# Patient Record
Sex: Female | Born: 1990 | Race: Black or African American | Hispanic: No | Marital: Single | Smoking: Never smoker
Health system: Southern US, Community
[De-identification: ages and names within clinical notes are randomized; demographics above are authoritative.]

## PROBLEM LIST (undated history)

## (undated) DIAGNOSIS — Z8619 Personal history of other infectious and parasitic diseases: Secondary | ICD-10-CM

## (undated) DIAGNOSIS — D649 Anemia, unspecified: Secondary | ICD-10-CM

## (undated) HISTORY — DX: Anemia, unspecified: D64.9

## (undated) HISTORY — DX: Personal history of other infectious and parasitic diseases: Z86.19

## (undated) HISTORY — PX: NO PAST SURGERIES: SHX2092

---

## 2014-11-14 LAB — OB RESULTS CONSOLE ABO/RH: RH Type: POSITIVE

## 2014-11-14 LAB — OB RESULTS CONSOLE RUBELLA ANTIBODY, IGM: RUBELLA: NON-IMMUNE/NOT IMMUNE

## 2014-11-14 LAB — OB RESULTS CONSOLE ANTIBODY SCREEN: ANTIBODY SCREEN: NEGATIVE

## 2014-11-14 LAB — OB RESULTS CONSOLE RPR: RPR: NONREACTIVE

## 2014-11-14 LAB — OB RESULTS CONSOLE GC/CHLAMYDIA
CHLAMYDIA, DNA PROBE: NEGATIVE
GC PROBE AMP, GENITAL: NEGATIVE

## 2014-11-14 LAB — OB RESULTS CONSOLE HIV ANTIBODY (ROUTINE TESTING): HIV: NONREACTIVE

## 2014-11-14 LAB — OB RESULTS CONSOLE HEPATITIS B SURFACE ANTIGEN: Hepatitis B Surface Ag: NEGATIVE

## 2015-06-23 ENCOUNTER — Encounter (HOSPITAL_COMMUNITY): Payer: Self-pay | Admitting: *Deleted

## 2015-06-23 ENCOUNTER — Telehealth (HOSPITAL_COMMUNITY): Payer: Self-pay | Admitting: *Deleted

## 2015-06-23 LAB — OB RESULTS CONSOLE GBS: STREP GROUP B AG: POSITIVE

## 2015-06-23 NOTE — Telephone Encounter (Signed)
Preadmission screen  

## 2015-06-24 ENCOUNTER — Inpatient Hospital Stay (HOSPITAL_COMMUNITY)
Admission: AD | Admit: 2015-06-24 | Discharge: 2015-06-24 | Disposition: A | Discharge status: Home / Self Care | Payer: BLUE CROSS/BLUE SHIELD | Source: Ambulatory Visit | Attending: Obstetrics and Gynecology | Admitting: Obstetrics and Gynecology

## 2015-06-24 ENCOUNTER — Encounter (HOSPITAL_COMMUNITY): Payer: Self-pay | Admitting: *Deleted

## 2015-06-24 DIAGNOSIS — Z3A00 Weeks of gestation of pregnancy not specified: Secondary | ICD-10-CM

## 2015-06-24 NOTE — Discharge Instructions (Signed)
Fetal Movement Counts °Patient Name: __________________________________________________ Patient Due Date: ____________________ °Performing a fetal movement count is highly recommended in high-risk pregnancies, but it is good for every pregnant woman to do. Your health care provider may ask you to start counting fetal movements at 28 weeks of the pregnancy. Fetal movements often increase: °· After eating a full meal. °· After physical activity. °· After eating or drinking something sweet or cold. °· At rest. °Pay attention to when you feel the baby is most active. This will help you notice a pattern of your baby's sleep and wake cycles and what factors contribute to an increase in fetal movement. It is important to perform a fetal movement count at the same time each day when your baby is normally most active.  °HOW TO COUNT FETAL MOVEMENTS °1. Find a quiet and comfortable area to sit or lie down on your left side. Lying on your left side provides the best blood and oxygen circulation to your baby. °2. Write down the day and time on a sheet of paper or in a journal. °3. Start counting kicks, flutters, swishes, rolls, or jabs in a 2-hour period. You should feel at least 10 movements within 2 hours. °4. If you do not feel 10 movements in 2 hours, wait 2-3 hours and count again. Look for a change in the pattern or not enough counts in 2 hours. °SEEK MEDICAL CARE IF: °· You feel less than 10 counts in 2 hours, tried twice. °· There is no movement in over an hour. °· The pattern is changing or taking longer each day to reach 10 counts in 2 hours. °· You feel the baby is not moving as he or she usually does. °Date: ____________ Movements: ____________ Start time: ____________ Finish time: ____________  °Date: ____________ Movements: ____________ Start time: ____________ Finish time: ____________ °Date: ____________ Movements: ____________ Start time: ____________ Finish time: ____________ °Date: ____________ Movements:  ____________ Start time: ____________ Finish time: ____________ °Date: ____________ Movements: ____________ Start time: ____________ Finish time: ____________ °Date: ____________ Movements: ____________ Start time: ____________ Finish time: ____________ °Date: ____________ Movements: ____________ Start time: ____________ Finish time: ____________ °Date: ____________ Movements: ____________ Start time: ____________ Finish time: ____________  °Date: ____________ Movements: ____________ Start time: ____________ Finish time: ____________ °Date: ____________ Movements: ____________ Start time: ____________ Finish time: ____________ °Date: ____________ Movements: ____________ Start time: ____________ Finish time: ____________ °Date: ____________ Movements: ____________ Start time: ____________ Finish time: ____________ °Date: ____________ Movements: ____________ Start time: ____________ Finish time: ____________ °Date: ____________ Movements: ____________ Start time: ____________ Finish time: ____________ °Date: ____________ Movements: ____________ Start time: ____________ Finish time: ____________  °Date: ____________ Movements: ____________ Start time: ____________ Finish time: ____________ °Date: ____________ Movements: ____________ Start time: ____________ Finish time: ____________ °Date: ____________ Movements: ____________ Start time: ____________ Finish time: ____________ °Date: ____________ Movements: ____________ Start time: ____________ Finish time: ____________ °Date: ____________ Movements: ____________ Start time: ____________ Finish time: ____________ °Date: ____________ Movements: ____________ Start time: ____________ Finish time: ____________ °Date: ____________ Movements: ____________ Start time: ____________ Finish time: ____________  °Date: ____________ Movements: ____________ Start time: ____________ Finish time: ____________ °Date: ____________ Movements: ____________ Start time: ____________ Finish  time: ____________ °Date: ____________ Movements: ____________ Start time: ____________ Finish time: ____________ °Date: ____________ Movements: ____________ Start time: ____________ Finish time: ____________ °Date: ____________ Movements: ____________ Start time: ____________ Finish time: ____________ °Date: ____________ Movements: ____________ Start time: ____________ Finish time: ____________ °Date: ____________ Movements: ____________ Start time: ____________ Finish time: ____________  °Date: ____________ Movements: ____________ Start time: ____________ Finish   time: ____________ °Date: ____________ Movements: ____________ Start time: ____________ Finish time: ____________ °Date: ____________ Movements: ____________ Start time: ____________ Finish time: ____________ °Date: ____________ Movements: ____________ Start time: ____________ Finish time: ____________ °Date: ____________ Movements: ____________ Start time: ____________ Finish time: ____________ °Date: ____________ Movements: ____________ Start time: ____________ Finish time: ____________ °Date: ____________ Movements: ____________ Start time: ____________ Finish time: ____________  °Date: ____________ Movements: ____________ Start time: ____________ Finish time: ____________ °Date: ____________ Movements: ____________ Start time: ____________ Finish time: ____________ °Date: ____________ Movements: ____________ Start time: ____________ Finish time: ____________ °Date: ____________ Movements: ____________ Start time: ____________ Finish time: ____________ °Date: ____________ Movements: ____________ Start time: ____________ Finish time: ____________ °Date: ____________ Movements: ____________ Start time: ____________ Finish time: ____________ °Date: ____________ Movements: ____________ Start time: ____________ Finish time: ____________  °Date: ____________ Movements: ____________ Start time: ____________ Finish time: ____________ °Date: ____________  Movements: ____________ Start time: ____________ Finish time: ____________ °Date: ____________ Movements: ____________ Start time: ____________ Finish time: ____________ °Date: ____________ Movements: ____________ Start time: ____________ Finish time: ____________ °Date: ____________ Movements: ____________ Start time: ____________ Finish time: ____________ °Date: ____________ Movements: ____________ Start time: ____________ Finish time: ____________ °Date: ____________ Movements: ____________ Start time: ____________ Finish time: ____________  °Date: ____________ Movements: ____________ Start time: ____________ Finish time: ____________ °Date: ____________ Movements: ____________ Start time: ____________ Finish time: ____________ °Date: ____________ Movements: ____________ Start time: ____________ Finish time: ____________ °Date: ____________ Movements: ____________ Start time: ____________ Finish time: ____________ °Date: ____________ Movements: ____________ Start time: ____________ Finish time: ____________ °Date: ____________ Movements: ____________ Start time: ____________ Finish time: ____________ °  °This information is not intended to replace advice given to you by your health care provider. Make sure you discuss any questions you have with your health care provider. °  °Document Released: 03/31/2006 Document Revised: 03/22/2014 Document Reviewed: 12/27/2011 °Elsevier Interactive Patient Education ©2016 [employer] °Vaginal Delivery °During delivery, your health care provider will help you give birth to your baby. During a vaginal delivery, you will work to push the baby out of your vagina. However, before you can push your baby out, a few things need to happen. The opening of your uterus (cervix) has to soften, thin out, and open up (dilate) all the way to 10 cm. Also, your baby has to move down from the uterus into your vagina.  °SIGNS OF LABOR  °Your health care provider will first need to make sure you  are in labor. Signs of labor include:  °· Passing what is called the mucous plug before labor begins. This is a small amount of blood-stained mucus. °· Having regular, painful uterine contractions.   °· The time between contractions gets shorter.   °· The discomfort and pain gradually get more intense. °· Contraction pains get worse when walking and do not go away when resting.   °· Your cervix becomes thinner (effacement) and dilates. °BEFORE THE DELIVERY °Once you are in labor and admitted into the hospital or care center, your health care provider may do the following:  °5. Perform a complete physical exam. °6. Review any complications related to pregnancy or labor.  °7. Check your blood pressure, pulse, temperature, and heart rate (vital signs).   °8. Determine if, and when, the rupture of amniotic membranes occurred. °9. Do a vaginal exam (using a sterile glove and lubricant) to determine:   °1. The position (presentation) of the baby. Is the baby's head presenting first (vertex) in the birth canal (vagina), or are the feet or buttocks first (breech)?   °2. The level (station) of the baby's head within   the birth canal.   °3. The effacement and dilatation of the cervix.   °10. An electronic fetal monitor is usually placed on your abdomen when you first arrive. This is used to monitor your contractions and the baby's heart rate. °1. When the monitor is on your abdomen (external fetal monitor), it can only pick up the frequency and length of your contractions. It cannot tell the strength of your contractions. °2. If it becomes necessary for your health care provider to know exactly how strong your contractions are or to see exactly what the baby's heart rate is doing, an internal monitor may be inserted into your vagina and uterus. Your health care provider will discuss the benefits and risks of using an internal monitor and obtain your permission before inserting the device. °3. Continuous fetal monitoring may be  needed if you have an epidural, are receiving certain medicines (such as oxytocin), or have pregnancy or labor complications. °11. An IV access tube may be placed into a vein in your arm to deliver fluids and medicines if necessary. °THREE STAGES OF LABOR AND DELIVERY °Normal labor and delivery is divided into three stages. °First Stage °This stage starts when you begin to contract regularly and your cervix begins to efface and dilate. It ends when your cervix is completely open (fully dilated). The first stage is the longest stage of labor and can last from 3 hours to 15 hours.  °Several methods are available to help with labor pain. You and your health care provider will decide which option is best for you. Options include:  °· Opioid medicines. These are strong pain medicines that you can get through your IV tube or as a shot into your muscle. These medicines lessen pain but do not make it go away completely.  °· Epidural. A medicine is given through a thin tube that is inserted in your back. The medicine numbs the lower part of your body and prevents any pain in that area. °· Paracervical pain medicine. This is an injection of an anesthetic on each side of your cervix.   °· You may request natural childbirth, which does not involve the use of pain medicines or an epidural during labor and delivery. Instead, you will use other things, such as breathing exercises, to help cope with the pain. °Second Stage °The second stage of labor begins when your cervix is fully dilated at 10 cm. It continues until you push your baby down through the birth canal and the baby is born. This stage can take only minutes or several hours. °· The location of your baby's head as it moves through the birth canal is reported as a number called a station. If the baby's head has not started its descent, the station is described as being at minus 3 (-3). When your baby's head is at the zero station, it is at the middle of the birth canal  and is engaged in the pelvis. The station of your baby helps indicate the progress of the second stage of labor. °· When your baby is born, your health care provider may hold the baby with his or her head lowered to prevent amniotic fluid, mucus, and blood from getting into the baby's lungs. The baby's mouth and nose may be suctioned with a small bulb syringe to remove any additional fluid. °· Your health care provider may then place the baby on your stomach. It is important to keep the baby from getting cold. To do this, the health care provider will dry   the baby off, place the baby directly on your skin (with no blankets between you and the baby), and cover the baby with warm, dry blankets.   °· The umbilical cord is cut. °Third Stage °During the third stage of labor, your health care provider will deliver the placenta (afterbirth) and make sure your bleeding is under control. The delivery of the placenta usually takes about 5 minutes but can take up to 30 minutes. After the placenta is delivered, a medicine may be given either by IV or injection to help contract the uterus and control bleeding. If you are planning to breastfeed, you can try to do so now. °After you deliver the placenta, your uterus should contract and get very firm. If your uterus does not remain firm, your health care provider will massage it. This is important because the contraction of the uterus helps cut off bleeding at the site where the placenta was attached to your uterus. If your uterus does not contract properly and stay firm, you may continue to bleed heavily. If there is a lot of bleeding, medicines may be given to contract the uterus and stop the bleeding.  °  °This information is not intended to replace advice given to you by your health care provider. Make sure you discuss any questions you have with your health care provider. °  °Document Released: 12/09/2007 Document Revised: 03/22/2014 Document Reviewed: 10/27/2011 °Elsevier  Interactive Patient Education ©2016 [employer] Inc. ° °

## 2015-06-24 NOTE — MAU Note (Signed)
Pt reports contractions and pressure x 4 hours, some brown discharge.

## 2015-06-24 NOTE — MAU Note (Signed)
Dr. Rana SnareLowe notified of pt status, EFM, ctxs, SVE. Orders received to discharge pt home with labor instructions.

## 2015-06-25 ENCOUNTER — Encounter (HOSPITAL_COMMUNITY): Payer: Self-pay

## 2015-06-25 ENCOUNTER — Inpatient Hospital Stay (HOSPITAL_COMMUNITY)
Admission: AD | Admit: 2015-06-25 | Discharge: 2015-06-25 | Disposition: A | Discharge status: Home / Self Care | Payer: BLUE CROSS/BLUE SHIELD | Source: Ambulatory Visit | Attending: Obstetrics and Gynecology | Admitting: Obstetrics and Gynecology

## 2015-06-25 NOTE — Discharge Instructions (Signed)

## 2015-06-25 NOTE — Progress Notes (Signed)
Dr Langston MaskerMorris notified of no change in VE, contraction, FHR tracing orders received to discharge home

## 2015-06-25 NOTE — Progress Notes (Signed)
Notified of pt arrival in MAU and exam with FHR variables. Will monitor FHR until reactive and let patient walk

## 2015-06-26 ENCOUNTER — Encounter (HOSPITAL_COMMUNITY)
Admission: AD | Disposition: A | Discharge status: Home / Self Care | Payer: Self-pay | Source: Ambulatory Visit | Attending: Obstetrics & Gynecology

## 2015-06-26 ENCOUNTER — Inpatient Hospital Stay (HOSPITAL_COMMUNITY): Payer: BLUE CROSS/BLUE SHIELD

## 2015-06-26 ENCOUNTER — Inpatient Hospital Stay (HOSPITAL_COMMUNITY): Payer: BLUE CROSS/BLUE SHIELD | Admitting: Anesthesiology

## 2015-06-26 ENCOUNTER — Inpatient Hospital Stay (HOSPITAL_COMMUNITY)
Admission: AD | Admit: 2015-06-26 | Discharge: 2015-06-28 | DRG: 766 | Disposition: A | Discharge status: Home / Self Care | Payer: BLUE CROSS/BLUE SHIELD | Source: Ambulatory Visit | Attending: Obstetrics & Gynecology | Admitting: Obstetrics & Gynecology

## 2015-06-26 ENCOUNTER — Encounter (HOSPITAL_COMMUNITY): Payer: Self-pay

## 2015-06-26 DIAGNOSIS — Z880 Allergy status to penicillin: Secondary | ICD-10-CM | POA: Diagnosis not present

## 2015-06-26 DIAGNOSIS — Z3A40 40 weeks gestation of pregnancy: Secondary | ICD-10-CM

## 2015-06-26 DIAGNOSIS — O99824 Streptococcus B carrier state complicating childbirth: Secondary | ICD-10-CM | POA: Diagnosis present

## 2015-06-26 DIAGNOSIS — Z833 Family history of diabetes mellitus: Secondary | ICD-10-CM | POA: Diagnosis not present

## 2015-06-26 DIAGNOSIS — O4290 Premature rupture of membranes, unspecified as to length of time between rupture and onset of labor, unspecified weeks of gestation: Secondary | ICD-10-CM

## 2015-06-26 DIAGNOSIS — Z8249 Family history of ischemic heart disease and other diseases of the circulatory system: Secondary | ICD-10-CM | POA: Diagnosis not present

## 2015-06-26 DIAGNOSIS — O4202 Full-term premature rupture of membranes, onset of labor within 24 hours of rupture: Secondary | ICD-10-CM | POA: Diagnosis present

## 2015-06-26 DIAGNOSIS — Z3689 Encounter for other specified antenatal screening: Secondary | ICD-10-CM

## 2015-06-26 DIAGNOSIS — O4292 Full-term premature rupture of membranes, unspecified as to length of time between rupture and onset of labor: Secondary | ICD-10-CM

## 2015-06-26 DIAGNOSIS — O339 Maternal care for disproportion, unspecified: Principal | ICD-10-CM | POA: Diagnosis present

## 2015-06-26 DIAGNOSIS — Z3490 Encounter for supervision of normal pregnancy, unspecified, unspecified trimester: Secondary | ICD-10-CM

## 2015-06-26 LAB — CBC
HEMATOCRIT: 39.7 % (ref 36.0–46.0)
HEMOGLOBIN: 13.5 g/dL (ref 12.0–15.0)
MCH: 29.3 pg (ref 26.0–34.0)
MCHC: 34 g/dL (ref 30.0–36.0)
MCV: 86.3 fL (ref 78.0–100.0)
Platelets: 220 10*3/uL (ref 150–400)
RBC: 4.6 MIL/uL (ref 3.87–5.11)
RDW: 13.2 % (ref 11.5–15.5)
WBC: 7.2 10*3/uL (ref 4.0–10.5)

## 2015-06-26 LAB — TYPE AND SCREEN
ABO/RH(D): O POS
Antibody Screen: NEGATIVE

## 2015-06-26 LAB — RPR: RPR Ser Ql: NONREACTIVE

## 2015-06-26 LAB — POCT FERN TEST: POCT FERN TEST: POSITIVE

## 2015-06-26 LAB — ABO/RH: ABO/RH(D): O POS

## 2015-06-26 SURGERY — Surgical Case
Anesthesia: Epidural

## 2015-06-26 MED ORDER — OXYTOCIN 10 UNIT/ML IJ SOLN
1.0000 m[IU]/min | INTRAVENOUS | Status: DC
  Administered 2015-06-26: 2 m[IU]/min via INTRAVENOUS

## 2015-06-26 MED ORDER — MENTHOL 3 MG MT LOZG: 1.0000 | LOZENGE | OROMUCOSAL | Status: DC | PRN

## 2015-06-26 MED ORDER — PHENYLEPHRINE 8 MG IN D5W 100 ML (0.08MG/ML) PREMIX OPTIME: INJECTION | INTRAVENOUS | Status: DC | PRN

## 2015-06-26 MED ORDER — NALBUPHINE HCL 10 MG/ML IJ SOLN: 5.0000 mg | Freq: Once | INTRAMUSCULAR | Status: DC | PRN

## 2015-06-26 MED ORDER — OXYCODONE-ACETAMINOPHEN 5-325 MG PO TABS: 2.0000 | ORAL_TABLET | ORAL | Status: DC | PRN

## 2015-06-26 MED ORDER — MEPERIDINE HCL 25 MG/ML IJ SOLN
INTRAMUSCULAR | Status: DC | PRN
  Administered 2015-06-26 (×2): 12.5 mg via INTRAVENOUS

## 2015-06-26 MED ORDER — ACETAMINOPHEN 325 MG PO TABS
650.0000 mg | ORAL_TABLET | ORAL | Status: DC | PRN
  Administered 2015-06-27 – 2015-06-28 (×4): 650 mg via ORAL
  Filled 2015-06-26 (×4): qty 2

## 2015-06-26 MED ORDER — OXYTOCIN 10 UNIT/ML IJ SOLN
2.5000 [IU]/h | INTRAVENOUS | Status: DC
  Filled 2015-06-26: qty 4

## 2015-06-26 MED ORDER — MORPHINE SULFATE (PF) 0.5 MG/ML IJ SOLN
INTRAMUSCULAR | Status: DC | PRN
  Administered 2015-06-26: 3 mg via EPIDURAL

## 2015-06-26 MED ORDER — ONDANSETRON HCL 4 MG/2ML IJ SOLN
INTRAMUSCULAR | Status: DC | PRN
  Administered 2015-06-26: 4 mg via INTRAVENOUS

## 2015-06-26 MED ORDER — OXYTOCIN BOLUS FROM INFUSION: 500.0000 mL | INTRAVENOUS | Status: DC

## 2015-06-26 MED ORDER — SODIUM BICARBONATE 8.4 % IV SOLN
INTRAVENOUS | Status: DC | PRN
  Administered 2015-06-26 (×2): 5 mL via EPIDURAL

## 2015-06-26 MED ORDER — IBUPROFEN 800 MG PO TABS
800.0000 mg | ORAL_TABLET | Freq: Three times a day (TID) | ORAL | Status: DC | PRN
  Administered 2015-06-27 – 2015-06-28 (×4): 800 mg via ORAL
  Filled 2015-06-26 (×4): qty 1

## 2015-06-26 MED ORDER — OXYTOCIN 10 UNIT/ML IJ SOLN
INTRAMUSCULAR | Status: AC
  Filled 2015-06-26: qty 4

## 2015-06-26 MED ORDER — GENTAMICIN SULFATE 40 MG/ML IJ SOLN: INTRAMUSCULAR | Status: DC

## 2015-06-26 MED ORDER — ONDANSETRON HCL 4 MG/2ML IJ SOLN
INTRAMUSCULAR | Status: AC
  Filled 2015-06-26: qty 2

## 2015-06-26 MED ORDER — ONDANSETRON HCL 4 MG/2ML IJ SOLN: 4.0000 mg | Freq: Four times a day (QID) | INTRAMUSCULAR | Status: DC | PRN

## 2015-06-26 MED ORDER — DIPHENHYDRAMINE HCL 25 MG PO CAPS: 25.0000 mg | ORAL_CAPSULE | Freq: Four times a day (QID) | ORAL | Status: DC | PRN

## 2015-06-26 MED ORDER — LIDOCAINE HCL (PF) 1 % IJ SOLN: 30.0000 mL | INTRAMUSCULAR | Status: DC | PRN

## 2015-06-26 MED ORDER — SODIUM CHLORIDE 0.9% FLUSH: 3.0000 mL | INTRAVENOUS | Status: DC | PRN

## 2015-06-26 MED ORDER — SODIUM CHLORIDE 0.9% FLUSH: 3.0000 mL | Freq: Two times a day (BID) | INTRAVENOUS | Status: DC

## 2015-06-26 MED ORDER — ACETAMINOPHEN 500 MG PO TABS
1000.0000 mg | ORAL_TABLET | Freq: Four times a day (QID) | ORAL | Status: AC
  Administered 2015-06-26 – 2015-06-27 (×3): 1000 mg via ORAL
  Filled 2015-06-26 (×3): qty 2

## 2015-06-26 MED ORDER — MEPERIDINE HCL 25 MG/ML IJ SOLN
INTRAMUSCULAR | Status: AC
  Filled 2015-06-26: qty 1

## 2015-06-26 MED ORDER — DIBUCAINE 1 % RE OINT: 1.0000 "application " | TOPICAL_OINTMENT | RECTAL | Status: DC | PRN

## 2015-06-26 MED ORDER — NALBUPHINE HCL 10 MG/ML IJ SOLN: 5.0000 mg | INTRAMUSCULAR | Status: DC | PRN

## 2015-06-26 MED ORDER — TERBUTALINE SULFATE 1 MG/ML IJ SOLN: 0.2500 mg | Freq: Once | INTRAMUSCULAR | Status: DC | PRN

## 2015-06-26 MED ORDER — LIDOCAINE-EPINEPHRINE (PF) 2 %-1:200000 IJ SOLN
INTRAMUSCULAR | Status: AC
Start: 2015-06-26 — End: 2015-06-26
  Filled 2015-06-26: qty 20

## 2015-06-26 MED ORDER — DIPHENHYDRAMINE HCL 25 MG PO CAPS
25.0000 mg | ORAL_CAPSULE | ORAL | Status: DC | PRN
  Filled 2015-06-26: qty 1

## 2015-06-26 MED ORDER — OXYCODONE HCL 5 MG PO TABS
5.0000 mg | ORAL_TABLET | ORAL | Status: DC | PRN
  Administered 2015-06-27 – 2015-06-28 (×4): 5 mg via ORAL
  Filled 2015-06-26 (×4): qty 1

## 2015-06-26 MED ORDER — HYDROMORPHONE HCL 1 MG/ML IJ SOLN
0.2500 mg | INTRAMUSCULAR | Status: DC | PRN
Start: 2015-06-26 — End: 2015-06-26

## 2015-06-26 MED ORDER — KETOROLAC TROMETHAMINE 30 MG/ML IJ SOLN
INTRAMUSCULAR | Status: AC
  Administered 2015-06-26: 30 mg
  Filled 2015-06-26: qty 1

## 2015-06-26 MED ORDER — SCOPOLAMINE 1 MG/3DAYS TD PT72
MEDICATED_PATCH | TRANSDERMAL | Status: AC
Start: 2015-06-26 — End: 2015-06-26
  Filled 2015-06-26: qty 1

## 2015-06-26 MED ORDER — LACTATED RINGERS IV SOLN
500.0000 mL | INTRAVENOUS | Status: DC | PRN
  Administered 2015-06-26: 500 mL via INTRAVENOUS

## 2015-06-26 MED ORDER — BISACODYL 10 MG RE SUPP: 10.0000 mg | Freq: Every day | RECTAL | Status: DC | PRN

## 2015-06-26 MED ORDER — SCOPOLAMINE 1 MG/3DAYS TD PT72
MEDICATED_PATCH | TRANSDERMAL | Status: DC | PRN
  Administered 2015-06-26: 1 via TRANSDERMAL

## 2015-06-26 MED ORDER — MEPERIDINE HCL 25 MG/ML IJ SOLN: 6.2500 mg | INTRAMUSCULAR | Status: DC | PRN

## 2015-06-26 MED ORDER — SODIUM CHLORIDE 0.9 % IR SOLN
Status: DC | PRN
  Administered 2015-06-26: 1000 mL

## 2015-06-26 MED ORDER — EPHEDRINE 5 MG/ML INJ: 10.0000 mg | INTRAVENOUS | Status: DC | PRN

## 2015-06-26 MED ORDER — OXYCODONE-ACETAMINOPHEN 5-325 MG PO TABS
1.0000 | ORAL_TABLET | ORAL | Status: DC | PRN
Start: 2015-06-26 — End: 2015-06-26

## 2015-06-26 MED ORDER — FLEET ENEMA 7-19 GM/118ML RE ENEM: 1.0000 | ENEMA | RECTAL | Status: DC | PRN

## 2015-06-26 MED ORDER — LACTATED RINGERS IV SOLN
40.0000 [IU] | INTRAVENOUS | Status: DC | PRN
  Administered 2015-06-26: 40 [IU] via INTRAVENOUS

## 2015-06-26 MED ORDER — PHENYLEPHRINE 40 MCG/ML (10ML) SYRINGE FOR IV PUSH (FOR BLOOD PRESSURE SUPPORT)
PREFILLED_SYRINGE | INTRAVENOUS | Status: AC
  Filled 2015-06-26: qty 10

## 2015-06-26 MED ORDER — COCONUT OIL OIL: 1.0000 "application " | TOPICAL_OIL | Status: DC | PRN

## 2015-06-26 MED ORDER — WITCH HAZEL-GLYCERIN EX PADS: 1.0000 "application " | MEDICATED_PAD | CUTANEOUS | Status: DC | PRN

## 2015-06-26 MED ORDER — ZOLPIDEM TARTRATE 5 MG PO TABS: 5.0000 mg | ORAL_TABLET | Freq: Every evening | ORAL | Status: DC | PRN

## 2015-06-26 MED ORDER — TETANUS-DIPHTH-ACELL PERTUSSIS 5-2.5-18.5 LF-MCG/0.5 IM SUSP: 0.5000 mL | Freq: Once | INTRAMUSCULAR | Status: DC

## 2015-06-26 MED ORDER — SENNOSIDES-DOCUSATE SODIUM 8.6-50 MG PO TABS
2.0000 | ORAL_TABLET | ORAL | Status: DC
  Administered 2015-06-27 (×2): 2 via ORAL
  Filled 2015-06-26 (×2): qty 2

## 2015-06-26 MED ORDER — SCOPOLAMINE 1 MG/3DAYS TD PT72: 1.0000 | MEDICATED_PATCH | Freq: Once | TRANSDERMAL | Status: DC

## 2015-06-26 MED ORDER — SIMETHICONE 80 MG PO CHEW: 80.0000 mg | CHEWABLE_TABLET | ORAL | Status: DC | PRN

## 2015-06-26 MED ORDER — SIMETHICONE 80 MG PO CHEW
80.0000 mg | CHEWABLE_TABLET | ORAL | Status: DC
  Filled 2015-06-26 (×2): qty 1

## 2015-06-26 MED ORDER — OXYTOCIN 10 UNIT/ML IJ SOLN
2.5000 [IU]/h | INTRAVENOUS | Status: AC
  Administered 2015-06-27: 2.5 [IU]/h via INTRAVENOUS

## 2015-06-26 MED ORDER — MEASLES, MUMPS & RUBELLA VAC ~~LOC~~ INJ
0.5000 mL | INJECTION | Freq: Once | SUBCUTANEOUS | Status: DC
  Filled 2015-06-26: qty 0.5

## 2015-06-26 MED ORDER — NALOXONE HCL 2 MG/2ML IJ SOSY
1.0000 ug/kg/h | PREFILLED_SYRINGE | INTRAVENOUS | Status: DC | PRN
  Filled 2015-06-26: qty 2

## 2015-06-26 MED ORDER — ONDANSETRON HCL 4 MG/2ML IJ SOLN: 4.0000 mg | Freq: Three times a day (TID) | INTRAMUSCULAR | Status: DC | PRN

## 2015-06-26 MED ORDER — DIPHENHYDRAMINE HCL 50 MG/ML IJ SOLN: 12.5000 mg | INTRAMUSCULAR | Status: DC | PRN

## 2015-06-26 MED ORDER — ACETAMINOPHEN 325 MG PO TABS
650.0000 mg | ORAL_TABLET | ORAL | Status: DC | PRN
  Administered 2015-06-26: 650 mg via ORAL
  Filled 2015-06-26: qty 2

## 2015-06-26 MED ORDER — DEXAMETHASONE SODIUM PHOSPHATE 4 MG/ML IJ SOLN
INTRAMUSCULAR | Status: AC
  Filled 2015-06-26: qty 1

## 2015-06-26 MED ORDER — PHENYLEPHRINE 40 MCG/ML (10ML) SYRINGE FOR IV PUSH (FOR BLOOD PRESSURE SUPPORT)
80.0000 ug | PREFILLED_SYRINGE | INTRAVENOUS | Status: DC | PRN
  Administered 2015-06-26: 80 ug via INTRAVENOUS

## 2015-06-26 MED ORDER — CITRIC ACID-SODIUM CITRATE 334-500 MG/5ML PO SOLN
30.0000 mL | ORAL | Status: DC | PRN
  Administered 2015-06-26: 30 mL via ORAL
  Filled 2015-06-26: qty 15

## 2015-06-26 MED ORDER — SIMETHICONE 80 MG PO CHEW
80.0000 mg | CHEWABLE_TABLET | Freq: Three times a day (TID) | ORAL | Status: DC
  Administered 2015-06-27 – 2015-06-28 (×7): 80 mg via ORAL
  Filled 2015-06-26 (×5): qty 1

## 2015-06-26 MED ORDER — BUTORPHANOL TARTRATE 1 MG/ML IJ SOLN: 1.0000 mg | INTRAMUSCULAR | Status: DC | PRN

## 2015-06-26 MED ORDER — LACTATED RINGERS IV SOLN
INTRAVENOUS | Status: DC
  Administered 2015-06-26 (×3): via INTRAVENOUS

## 2015-06-26 MED ORDER — FENTANYL 2.5 MCG/ML BUPIVACAINE 1/10 % EPIDURAL INFUSION (WH - ANES)
14.0000 mL/h | INTRAMUSCULAR | Status: DC | PRN
  Administered 2015-06-26: 14 mL/h via EPIDURAL
  Filled 2015-06-26: qty 125

## 2015-06-26 MED ORDER — FLEET ENEMA 7-19 GM/118ML RE ENEM: 1.0000 | ENEMA | Freq: Every day | RECTAL | Status: DC | PRN

## 2015-06-26 MED ORDER — KETOROLAC TROMETHAMINE 30 MG/ML IJ SOLN: 30.0000 mg | Freq: Once | INTRAMUSCULAR | Status: DC

## 2015-06-26 MED ORDER — LACTATED RINGERS IV SOLN: 500.0000 mL | Freq: Once | INTRAVENOUS | Status: DC

## 2015-06-26 MED ORDER — PHENYLEPHRINE 40 MCG/ML (10ML) SYRINGE FOR IV PUSH (FOR BLOOD PRESSURE SUPPORT)
80.0000 ug | PREFILLED_SYRINGE | INTRAVENOUS | Status: DC | PRN
  Filled 2015-06-26: qty 20

## 2015-06-26 MED ORDER — DEXAMETHASONE SODIUM PHOSPHATE 4 MG/ML IJ SOLN
INTRAMUSCULAR | Status: DC | PRN
  Administered 2015-06-26: 4 mg via INTRAVENOUS

## 2015-06-26 MED ORDER — NALOXONE HCL 0.4 MG/ML IJ SOLN: 0.4000 mg | INTRAMUSCULAR | Status: DC | PRN

## 2015-06-26 MED ORDER — CLINDAMYCIN PHOSPHATE 900 MG/50ML IV SOLN
900.0000 mg | Freq: Three times a day (TID) | INTRAVENOUS | Status: DC
  Administered 2015-06-26: 900 mg via INTRAVENOUS
  Filled 2015-06-26 (×2): qty 50

## 2015-06-26 MED ORDER — MORPHINE SULFATE (PF) 0.5 MG/ML IJ SOLN
INTRAMUSCULAR | Status: AC
Start: 2015-06-26 — End: 2015-06-26
  Filled 2015-06-26: qty 10

## 2015-06-26 MED ORDER — PRENATAL MULTIVITAMIN CH
1.0000 | ORAL_TABLET | Freq: Every day | ORAL | Status: DC
  Administered 2015-06-27 – 2015-06-28 (×2): 1 via ORAL
  Filled 2015-06-26 (×2): qty 1

## 2015-06-26 MED ORDER — LACTATED RINGERS IV SOLN
INTRAVENOUS | Status: DC | PRN
  Administered 2015-06-26 (×2): via INTRAVENOUS

## 2015-06-26 MED ORDER — SODIUM BICARBONATE 8.4 % IV SOLN
INTRAVENOUS | Status: AC
  Filled 2015-06-26: qty 50

## 2015-06-26 MED ORDER — SODIUM CHLORIDE 0.9 % IV SOLN: 250.0000 mL | INTRAVENOUS | Status: DC

## 2015-06-26 MED ORDER — LIDOCAINE HCL (PF) 1 % IJ SOLN
INTRAMUSCULAR | Status: DC | PRN
  Administered 2015-06-26: 5 mL via EPIDURAL
  Administered 2015-06-26: 8 mL via EPIDURAL

## 2015-06-26 SURGICAL SUPPLY — 29 items
CHLORAPREP W/TINT 26ML (MISCELLANEOUS) ×3 IMPLANT
CLAMP CORD UMBIL (MISCELLANEOUS) IMPLANT
CLOSURE STERI STRIP 1/2 X4 (GAUZE/BANDAGES/DRESSINGS) ×2 IMPLANT
CLOSURE WOUND 1/2 X4 (GAUZE/BANDAGES/DRESSINGS) ×1
CLOTH BEACON ORANGE TIMEOUT ST (SAFETY) ×3 IMPLANT
DRSG OPSITE POSTOP 4X10 (GAUZE/BANDAGES/DRESSINGS) ×3 IMPLANT
ELECT REM PT RETURN 9FT ADLT (ELECTROSURGICAL) ×3
ELECTRODE REM PT RTRN 9FT ADLT (ELECTROSURGICAL) ×1 IMPLANT
EXTRACTOR VACUUM M CUP 4 TUBE (SUCTIONS) IMPLANT
EXTRACTOR VACUUM M CUP 4' TUBE (SUCTIONS)
GLOVE BIO SURGEON STRL SZ7 (GLOVE) ×3 IMPLANT
GLOVE BIOGEL PI IND STRL 7.0 (GLOVE) ×2 IMPLANT
GLOVE BIOGEL PI INDICATOR 7.0 (GLOVE) ×4
GOWN STRL REUS W/TWL LRG LVL3 (GOWN DISPOSABLE) ×6 IMPLANT
KIT ABG SYR 3ML LUER SLIP (SYRINGE) ×3 IMPLANT
NEEDLE HYPO 25X5/8 SAFETYGLIDE (NEEDLE) ×6 IMPLANT
NS IRRIG 1000ML POUR BTL (IV SOLUTION) ×3 IMPLANT
PACK C SECTION WH (CUSTOM PROCEDURE TRAY) ×3 IMPLANT
PAD OB MATERNITY 4.3X12.25 (PERSONAL CARE ITEMS) ×3 IMPLANT
PENCIL BUTTON HOLSTER BLD 10FT (ELECTRODE) ×9 IMPLANT
PENCIL SMOKE EVAC W/HOLSTER (ELECTROSURGICAL) ×3 IMPLANT
STRIP CLOSURE SKIN 1/2X4 (GAUZE/BANDAGES/DRESSINGS) ×2 IMPLANT
SUT CHROMIC 0 CTX 36 (SUTURE) ×9 IMPLANT
SUT MON AB 4-0 PS1 27 (SUTURE) ×3 IMPLANT
SUT PDS AB 0 CT1 27 (SUTURE) ×6 IMPLANT
SUT VIC AB 3-0 CT1 27 (SUTURE) ×4
SUT VIC AB 3-0 CT1 TAPERPNT 27 (SUTURE) ×2 IMPLANT
TOWEL OR 17X24 6PK STRL BLUE (TOWEL DISPOSABLE) ×3 IMPLANT
TRAY FOLEY CATH SILVER 14FR (SET/KITS/TRAYS/PACK) ×3 IMPLANT

## 2015-06-26 NOTE — Anesthesia Procedure Notes (Signed)
Epidural Patient location during procedure: OB Start time: 06/26/2015 2:54 AM End time: 06/26/2015 2:58 AM  Staffing Anesthesiologist: Leilani AbleHATCHETT, Tyrease Vandeberg Performed by: anesthesiologist   Preanesthetic Checklist Completed: patient identified, surgical consent, pre-op evaluation, timeout performed, IV checked, risks and benefits discussed and monitors and equipment checked  Epidural Patient position: sitting Prep: site prepped and draped and DuraPrep Patient monitoring: continuous pulse ox and blood pressure Approach: midline Location: L3-L4 Injection technique: LOR air  Needle:  Needle type: Tuohy  Needle gauge: 17 G Needle length: 9 cm and 9 Needle insertion depth: 6 cm Catheter type: closed end flexible Catheter size: 19 Gauge Catheter at skin depth: 11 cm Test dose: negative and Other  Assessment Sensory level: T9 Events: blood not aspirated, injection not painful, no injection resistance, negative IV test and no paresthesia  Additional Notes Reason for block:procedure for pain

## 2015-06-26 NOTE — Transfer of Care (Signed)
Immediate Anesthesia Transfer of Care Note  Patient: Whitney Wheeler  Procedure(s) Performed: Procedure(s): CESAREAN SECTION (N/A)  Patient Location: PACU  Anesthesia Type:Epidural  Level of Consciousness: awake, alert  and oriented  Airway & Oxygen Therapy: Patient Spontanous Breathing  Post-op Assessment: Report given to RN and Post -op Vital signs reviewed and stable  Post vital signs: Reviewed and stable  Last Vitals:  Filed Vitals:   06/26/15 1043 06/26/15 1055  BP:    Pulse:    Temp:    Resp: 20 20    Complications: No apparent anesthesia complications

## 2015-06-26 NOTE — H&P (Signed)
Whitney Wheeler is a 25 y.o. female presenting for labor.  SROM at midnight with increase in intensity of CTX.  Active FM.  No VB.  Antepartum course complicated by Rubella NI.  Also, normal first tri screen but risk for Tri 13/18 was greater after screen compared to baseline (1:415).  GBS positive.    Maternal Medical History:  Reason for admission: Rupture of membranes.   Contractions: Onset was 13-24 hours ago.   Frequency: regular.   Perceived severity is moderate.    Fetal activity: Perceived fetal activity is normal.   Last perceived fetal movement was within the past hour.    Prenatal Complications - Diabetes: none.    OB History    Gravida Para Term Preterm AB TAB SAB Ectopic Multiple Living   1              Past Medical History  Diagnosis Date  . Hx of varicella   . Anemia    Past Surgical History  Procedure Laterality Date  . No past surgeries     Family History: family history includes Alcohol abuse in her maternal grandfather; Diabetes in her maternal grandfather; Hypertension in her father and mother; Kidney Stones in her mother and paternal grandmother; Wolff Parkinson White syndrome in her mother. Social History:  reports that she has never smoked. She has never used smokeless tobacco. She reports that she does not drink alcohol or use illicit drugs.   Prenatal Transfer Tool  Maternal Diabetes: No Genetic Screening: Normal Maternal Ultrasounds/Referrals: Normal Fetal Ultrasounds or other Referrals:  None Maternal Substance Abuse:  No Significant Maternal Medications:  None Significant Maternal Lab Results:  Lab values include: Group B Strep positive Other Comments:  None  ROS  Dilation: 4 Effacement (%): 50 Station: -2 Exam by:: Dr. Langston MaskerMorris  Blood pressure 129/76, pulse 110, temperature 99.1 F (37.3 C), temperature source Oral, resp. rate 18, last menstrual period 09/19/2014, SpO2 99 %. Maternal Exam:  Uterine Assessment: Contraction strength is  moderate.  Contraction frequency is regular.   Abdomen: Patient reports no abdominal tenderness. Fundal height is c/w dates.   Estimated fetal weight is 8#.   Fetal presentation: vertex  Introitus: Normal vulva. Amniotic fluid character: clear.  Pelvis: adequate for delivery.   Cervix: Cervix evaluated by digital exam.     Physical Exam  Constitutional: She is oriented to person, place, and time. She appears well-developed and well-nourished.  GI: Soft. There is no rebound and no guarding.  Neurological: She is alert and oriented to person, place, and time.  Skin: Skin is warm and dry.  Psychiatric: She has a normal mood and affect. Her behavior is normal.    Prenatal labs: ABO, Rh: O/Positive/-- (09/01 0000) Antibody: Negative (09/01 0000) Rubella: Nonimmune (09/01 0000) RPR: Nonreactive (09/01 0000)  HBsAg: Negative (09/01 0000)  HIV: Non-reactive (09/01 0000)  GBS: Positive (04/10 0000)   Assessment/Plan: 24yo G1 at 7128w0d with labor -Clinda for GBS ppx (PCN allergy) -Epidural when ready -Anticipate NSVD   Whitney Wheeler 06/26/2015, 2:10 AM

## 2015-06-26 NOTE — Progress Notes (Signed)
Notified of vertex presentation and GBS. Will put in admission orders

## 2015-06-26 NOTE — Progress Notes (Signed)
Dr. Langston MaskerMorris returned call. FHR tracing reviewed with MD. Continue labor support until able to move patient to labor and delivery unit

## 2015-06-26 NOTE — Anesthesia Postprocedure Evaluation (Signed)
Anesthesia Post Note  Patient: Whitney Wheeler  Procedure(s) Performed: Procedure(s) (LRB): CESAREAN SECTION (N/A)  Patient location during evaluation: PACU Anesthesia Type: Epidural Level of consciousness: awake Pain management: satisfactory to patient Vital Signs Assessment: post-procedure vital signs reviewed and stable Respiratory status: spontaneous breathing Cardiovascular status: blood pressure returned to baseline Postop Assessment: no headache and spinal receding Anesthetic complications: no    Last Vitals:  Filed Vitals:   06/26/15 1300 06/26/15 1315  BP: 124/84 117/88  Pulse: 68 78  Temp: 37 C 37.9 C  Resp: 15 16    Last Pain:  Filed Vitals:   06/26/15 1325  PainSc: 3                  Vigeant,Kien Mirsky EDWARD

## 2015-06-26 NOTE — Progress Notes (Signed)
Message left regarding pt in MAU and requesting call back. 

## 2015-06-26 NOTE — Progress Notes (Signed)
Message left requesting call back regarding FHR tracing

## 2015-06-26 NOTE — MAU Note (Addendum)
Patient presents with PROM at 0000. Patient denies and bleeding. Ctx every . Fetus active.

## 2015-06-26 NOTE — Op Note (Signed)
Preoperative diagnosis: Cephalopelvic disproportion  Postoperative diagnosis: Same, plus straight OP presentation  Procedure: Primary low transverse cesarean section  Surgeon: Marcelle OverlieHolland  Anesthesia: Epidural  EBL: 700 cc  Procedure and findings:  Patient taken the operating room after an adequate level of epidural anesthesia was obtained the patient prepped and draped Foley catheter positioned. Appropriate timeouts taken at that point. Transverse incision made carried down to the fascia which was incised and extended transversely. Rectus muscles divided in the midline, peritoneum entered superiorly without incident and extended in a vertical fashion. The vesicouterine serosa was incised and the bladder was bluntly and sharply dissected below. Bladder blade repositioned transverse incision made in the lower segment extended with blunt dissection thin meconium was noted the infant was noted to be straight OP, the head was gently rotated and easily delivered a vigorous infant, the infant was suctioned cord clamped and passed to the pediatrician for further care. The placenta was then delivered manually intact. Uterus exteriorized, cavity wiped clean with a laparotomy pack, closure obtained the first layer of 0 chromic followed followed by an imbricating local layer of 0 chromic this was hemostatic. Bilateral tubes and ovaries were normal. Prior to closure sponge, needle, instrument counts reported as correct 2. Peritoneum was then closed with 3-0 Vicryl running suture the same on the rectus muscles in the midline. 0 PDS was then used to close the fascia transversely the subcutaneous tissue was hemostatic and was fairly thin 4-0 Monocryl subcuticular closure with a honeycomb dressing clear urine noted in the case she tolerated this well went to recovery room in good condition.  Dictated with [facility]  [place] Milana ObeyM Abigail Marsiglia M.D.

## 2015-06-26 NOTE — Anesthesia Preprocedure Evaluation (Signed)
Anesthesia Evaluation  Patient identified by MRN, date of birth, ID band Patient awake    Reviewed: Allergy & Precautions, H&P , NPO status , Patient's Chart, lab work & pertinent test results  Airway Mallampati: II  TM Distance: >3 FB Neck ROM: full    Dental no notable dental hx.    Pulmonary neg pulmonary ROS,    Pulmonary exam normal        Cardiovascular negative cardio ROS Normal cardiovascular exam     Neuro/Psych negative neurological ROS  negative psych ROS   GI/Hepatic negative GI ROS, Neg liver ROS,   Endo/Other  negative endocrine ROS  Renal/GU negative Renal ROS     Musculoskeletal   Abdominal (+) + obese,   Peds  Hematology   Anesthesia Other Findings   Reproductive/Obstetrics (+) Pregnancy                             Anesthesia Physical Anesthesia Plan  ASA: II  Anesthesia Plan: Epidural   Post-op Pain Management:    Induction:   Airway Management Planned:   Additional Equipment:   Intra-op Plan:   Post-operative Plan:   Informed Consent: I have reviewed the patients History and Physical, chart, labs and discussed the procedure including the risks, benefits and alternatives for the proposed anesthesia with the patient or authorized representative who has indicated his/her understanding and acceptance.     Plan Discussed with:   Anesthesia Plan Comments:         Anesthesia Quick Evaluation  

## 2015-06-26 NOTE — Progress Notes (Signed)
No change in cx despite adeq labor, discussed CPD and rec CS., pt agrees, proced + risks discussed

## 2015-06-26 NOTE — Progress Notes (Signed)
Stable FHR, still 5 cm, ctx q 3-4 on pit per protocol

## 2015-06-26 NOTE — Consult Note (Signed)
[facility]  Delivery Note:  C-section       06/26/2015  11:14 AM  I was called to the operating room at the request of the patient's obstetrician (Dr. Holland) for a primary c-section for failure to progress.  PRENATAL HX:  This is a 24 y/o G1P0 at 40 and 0/[redacted] weeks gestation who was admitted in active labor after SROM at ~ midnight last night (ROM 12 hours).  She is GBS positive and was treated with clindamycin.  Her pregnancy has been uncomplicated and delivery was by c-section for failure to progress.    DELIVERY:  Infant was vigorous at delivery, requiring no resuscitation other than standard warming, drying and stimulation.  APGARs 8 and 9.  Exam notable for molding and caput but otherwise was within normal limits.  After 5 minutes, baby left with nurse to assist parents with skin-to-skin care.   _____________________ Electronically Signed By: Kore Madlock, MD Neonatologist  

## 2015-06-26 NOTE — Progress Notes (Addendum)
Comfortable with epidural. FHT with minimal variability; no decelerations.  CTX have spaced out. SVE 5/50/-2, IUPC and FSE placed; scalp stim noted. Will start pitocin and closely monitor MVUs.  Mitchel HonourMegan Ratasha Fabre, DO

## 2015-06-27 LAB — CBC
HEMATOCRIT: 23.1 % — AB (ref 36.0–46.0)
Hemoglobin: 7.8 g/dL — ABNORMAL LOW (ref 12.0–15.0)
MCH: 29.3 pg (ref 26.0–34.0)
MCHC: 33.8 g/dL (ref 30.0–36.0)
MCV: 86.8 fL (ref 78.0–100.0)
Platelets: 162 10*3/uL (ref 150–400)
RBC: 2.66 MIL/uL — ABNORMAL LOW (ref 3.87–5.11)
RDW: 13.3 % (ref 11.5–15.5)
WBC: 11.8 10*3/uL — ABNORMAL HIGH (ref 4.0–10.5)

## 2015-06-27 NOTE — Lactation Note (Signed)
This note was copied from a baby's chart. Lactation Consultation Note  Patient Name: Whitney Wheeler Reason for consult: Initial assessment  Baby is 4129 hours old and has been consistently feeding at the breast.  Per mom last fed at 1400 for 25 mins.  Per mom requested for LC to show her how to use her DEBP [employer] pump so she will know how to use it when needed.  ( Even -flo DEBP Advance ( according to the box package - endored by a IBCLC ) LC set up the DEBP ( noted to be similiar to a DEBP Ameda ). LC was impressed 2 different flange sizes came with the pump.  Baby woke up diaper dry, and LC placed baby skin to skin in football position. Baby on and off latch at 1st, and once areola sandwiched and breast compressions with Latch baby fed for 8 mins with multiply swallows, increased with breast compressions. Baby released. Nipple well rounded when baby released and per mom comfortable with latch  And position. During consult LC reviewed basics - importance of skin to skin feedings until the baby can stay awake for a feeding, and to work with the baby to open her mouth by tickling her upper  Lip until she opens wide and then latch with breast compressions until swallows, and then intermittent.  Praised mom and baby for consistency and dad for support.  Mother informed of post-discharge support and given phone number to the lactation department, including services for phone call assistance; out-patient appointments; and breastfeeding support group. List of other breastfeeding resources in the community given in the handout. Encouraged mother to call for problems or concerns related to breastfeeding.   Maternal Data Has patient been taught Hand Expression?: Yes (steady flow of colostrun ) Does the patient have breastfeeding experience prior to this delivery?: No  Feeding Feeding Type: Breast Fed Length of feed: 8 min (multiply swallows , increased with breast  compressions )  LATCH Score/Interventions Latch: Repeated attempts needed to sustain latch, nipple held in mouth throughout feeding, stimulation needed to elicit sucking reflex. Intervention(s): Adjust position;Assist with latch;Breast massage;Breast compression  Audible Swallowing: Spontaneous and intermittent  Type of Nipple: Everted at rest and after stimulation  Comfort (Breast/Nipple): Soft / non-tender     Hold (Positioning): Assistance needed to correctly position infant at breast and maintain latch. (worked on depth and positioning ) Intervention(s): Breastfeeding basics reviewed;Support Pillows;Position options;Skin to skin  LATCH Score: 8  Lactation Tools Discussed/Used Tools: Shells (LC instructed mom due to semi compressible areolas and edema - mom plans to put on a bra and use them ) Shell Type: Inverted WIC Program: No Pump Review: Setup, frequency, and cleaning Initiated by:: MAI  Date initiated:: 06/27/15   Consult Status Consult Status: Follow-up Date: 06/28/15 Follow-up type: In-patient    Whitney Wheeler, Whitney Wheeler, 4:25 PM

## 2015-06-27 NOTE — Addendum Note (Signed)
Addendum  created 06/27/15 0924 by Shanon PayorSuzanne M Koven Belinsky, CRNA   Modules edited: Clinical Notes   Clinical Notes:  File: [id number]; Pend: [id number]

## 2015-06-27 NOTE — Anesthesia Postprocedure Evaluation (Signed)
Anesthesia Post Note  Patient: Whitney Wheeler  Procedure(s) Performed: Procedure(s) (LRB): CESAREAN SECTION (N/A)  Patient location during evaluation: Mother Baby Anesthesia Type: Epidural Level of consciousness: awake and alert and oriented Pain management: pain level controlled Vital Signs Assessment: post-procedure vital signs reviewed and stable Respiratory status: spontaneous breathing and nonlabored ventilation Cardiovascular status: stable Postop Assessment: no headache, no backache, patient able to bend at knees, no signs of nausea or vomiting and adequate PO intake Anesthetic complications: no    Last Vitals:  Filed Vitals:   06/27/15 0100 06/27/15 0500  BP: 120/50 109/54  Pulse: 67 67  Temp: 37.2 C 37.2 C  Resp: 18 18    Last Pain:  Filed Vitals:   06/27/15 0752  PainSc: 0-No pain                 Nazanin Kinner

## 2015-06-27 NOTE — Progress Notes (Signed)
Patient is doing well.    BP 98/57 mmHg  Pulse 75  Temp(Src) 98.9 F (37.2 C) (Axillary)  Resp 18  Ht 4\' 10"  (1.473 m)  Wt 81.194 kg (179 lb)  BMI 37.42 kg/m2  SpO2 99%  LMP 09/19/2014  Breastfeeding? Unknown Results for orders placed or performed during the hospital encounter of 06/26/15 (from the past 24 hour(s))  CBC     Status: Abnormal   Collection Time: 06/27/15  5:26 AM  Result Value Ref Range   WBC 11.8 (H) 4.0 - 10.5 K/uL   RBC 2.66 (L) 3.87 - 5.11 MIL/uL   Hemoglobin 7.8 (L) 12.0 - 15.0 g/dL   HCT 40.923.1 (L) 81.136.0 - 91.446.0 %   MCV 86.8 78.0 - 100.0 fL   MCH 29.3 26.0 - 34.0 pg   MCHC 33.8 30.0 - 36.0 g/dL   RDW 78.213.3 95.611.5 - 21.315.5 %   Platelets 162 150 - 400 K/uL   Abdomen is soft and non tender Incision clean and dry and intact  POD #1 Doing well Routine care

## 2015-06-28 ENCOUNTER — Ambulatory Visit: Payer: Self-pay

## 2015-06-28 MED ORDER — IBUPROFEN 800 MG PO TABS: 800.0000 mg | ORAL_TABLET | Freq: Three times a day (TID) | ORAL | Status: AC | PRN

## 2015-06-28 MED ORDER — OXYCODONE HCL 5 MG PO TABS: 5.0000 mg | ORAL_TABLET | ORAL | Status: AC | PRN

## 2015-06-28 NOTE — Discharge Summary (Signed)
Obstetric Discharge Summary Reason for Admission: rupture of membranes Prenatal Procedures: none Intrapartum Procedures: cesarean: low cervical, transverse Postpartum Procedures: none Complications-Operative and Postpartum: none HEMOGLOBIN  Date Value Ref Range Status  06/27/2015 7.8* 12.0 - 15.0 g/dL Final    Comment:    REPEATED TO VERIFY DELTA CHECK NOTED    HCT  Date Value Ref Range Status  06/27/2015 23.1* 36.0 - 46.0 % Final    Physical Exam:  General: alert, cooperative and appears stated age 64Lochia: appropriate Uterine Fundus: firm Incision: healing well, no significant drainage, no dehiscence DVT Evaluation: No evidence of DVT seen on physical exam.  Discharge Diagnoses: Term Pregnancy-delivered  Discharge Information: Date: 06/28/2015 Activity: pelvic rest Diet: routine Medications: Ibuprofen and Percocet Condition: stable Instructions: refer to practice specific booklet Discharge to: home   Newborn Data: Live born female  Birth Weight: 8 lb 2.9 oz (3710 g) APGAR: 8, 9  Home with mother.  Johnnathan Hagemeister L 06/28/2015, 8:16 AM

## 2015-06-28 NOTE — Lactation Note (Signed)
This note was copied from a baby's chart. Lactation Consultation Note  Patient Name: Girl Burley SaverMiya Shurley WUJWJ'XToday's Date: 06/28/2015 Reason for consult: Follow-up assessment Baby 55 hours old. Mom reports that baby nursed earlier and isn't cueing to nurse now. However, this LC offered to assist with latching to offer some assistance with positioning. Baby attempted to latch, but then started sucking her own tongue. Demonstrated to mom how to perform suck training with her finger. Baby able to create a good seal, suckling rhythmically with lips flanged. Baby did attempt to push this LC's finger out with her tongue, but then would continue suckling rhythmically. Enc mom to provide suck-training between feedings, and especially just before latching. Enc [facility] or [facility] nurse's assistance at the next latch. Discussed assessment and interventions with patient's bedside nurse, Irving BurtonEmily, RN.   Maternal Data    Feeding Feeding Type: Breast Fed Length of feed: 0 min  LATCH Score/Interventions Latch: Repeated attempts needed to sustain latch, nipple held in mouth throughout feeding, stimulation needed to elicit sucking reflex. (keeping tongue up, better in football position)  Audible Swallowing: A few with stimulation  Type of Nipple: Everted at rest and after stimulation  Comfort (Breast/Nipple): Filling, red/small blisters or bruises, mild/mod discomfort     Hold (Positioning): Assistance needed to correctly position infant at breast and maintain latch.  LATCH Score: 6  Lactation Tools Discussed/Used     Consult Status Consult Status: Follow-up Date: 06/29/15 Follow-up type: In-patient    Geralynn OchsWILLIARD, Neidy Guerrieri 06/28/2015, 6:44 PM

## 2015-06-29 ENCOUNTER — Ambulatory Visit: Payer: Self-pay

## 2015-06-29 NOTE — Lactation Note (Signed)
This note was copied from a baby's chart. Lactation Consultation Note  Patient Name: Whitney Wheeler ZOXWR'UToday's Date: 06/29/2015 Reason for consult: Follow-up assessment;Breast/nipple pain   Follow up with mom of 70 hour old infant, Whitney Wheeler. Infant with 10 BF for 10-31 minutes, 4 viods and 3 stools in last 24 hours. LATCH Scores 6-8 by bedside RN's.  Mom with larch compressible breasts and small short shafted nipples. Nipples are noted to have healing scabs to tips. Mom reports her nipples feel much better today and pain is present with initial latch and improves with feeding. Mom reports her breasts are feeling fuller today and milk is easily expressed from both sides. Mom is using EBM to nipples post feeds. She reports infant is not sucking her tongue like she was yesterday.  Infant was laying in crib awake and rooting on hands, mom reports she recently finished feeding. Reviewed feeding cues and cluster feeds. Mom sat in chair and infant latched easily to right breast in football hold. Infant with flanged lips, rhythmic swallows and intermittent swallows that increased with breast compression. Taught mom how to recognize swallows. Enc mom to BF infant 8-12 x in 24 hours at first feeding cues and to awaken infant as needed with feeds to allow for nutritive suckling. Mom did well with awakening techniques.  Reviewed all BF information in Taking Care of Baby and Me Booklet. Reviewed I/O and maintaining feeding log and taking to Ped visit tomorrow. Reviewed engorgement prevention/Treatment, pre pumping to soften areola and comfort pumping. Reviewed LC Brochure, mom aware of LC Phone #, OP Services, and BF Support Groups. Enc mom to call with any questions/concerns prn.   Maternal Data Has patient been taught Hand Expression?: Yes Does the patient have breastfeeding experience prior to this delivery?: No  Feeding Feeding Type: Breast Fed Length of feed: 15 min  LATCH Score/Interventions Latch:  Grasps breast easily, tongue down, lips flanged, rhythmical sucking. Intervention(s): Skin to skin;Teach feeding cues;Waking techniques Intervention(s): Adjust position;Assist with latch;Breast massage;Breast compression  Audible Swallowing: Spontaneous and intermittent Intervention(s): Hand expression;Alternate breast massage;Skin to skin  Type of Nipple: Everted at rest and after stimulation  Comfort (Breast/Nipple): Filling, red/small blisters or bruises, mild/mod discomfort  Problem noted: Cracked, bleeding, blisters, bruises (Pain noted with initial latch and nipples are improved today per mom) Interventions  (Cracked/bleeding/bruising/blister): Expressed breast milk to nipple  Hold (Positioning): Assistance needed to correctly position infant at breast and maintain latch. Intervention(s): Breastfeeding basics reviewed;Support Pillows;Position options;Skin to skin  LATCH Score: 8  Lactation Tools Discussed/Used WIC Program: No Pump Review: Milk Storage   Consult Status Consult Status: Complete Follow-up type: Call as needed    Ed BlalockSharon S Stefan Markarian 06/29/2015, 9:23 AM

## 2015-06-30 NOTE — Addendum Note (Signed)
Addendum  created 06/30/15 1417 by Randa SpikeMyra D Carlisle Enke, CRNA   Modules edited: Anesthesia Events, Narrator   Narrator:  Narrator: Event Log Edited

## 2015-06-30 NOTE — Addendum Note (Signed)
Addendum  created 06/30/15 1419 by Randa SpikeMyra D Liley Rake, CRNA   Modules edited: Charges VN

## 2015-07-02 ENCOUNTER — Inpatient Hospital Stay (HOSPITAL_COMMUNITY): Admission: RE | Admit: 2015-07-02 | Payer: BLUE CROSS/BLUE SHIELD | Source: Ambulatory Visit

## 2015-07-04 ENCOUNTER — Encounter (HOSPITAL_COMMUNITY): Payer: Self-pay | Admitting: Obstetrics and Gynecology

## 2018-10-03 ENCOUNTER — Emergency Department (HOSPITAL_COMMUNITY)
Admission: EM | Admit: 2018-10-03 | Discharge: 2018-10-03 | Disposition: A | Discharge status: Home / Self Care | Payer: BC Managed Care – PPO | Attending: Emergency Medicine | Admitting: Emergency Medicine

## 2018-10-03 ENCOUNTER — Other Ambulatory Visit: Payer: Self-pay

## 2018-10-03 ENCOUNTER — Encounter (HOSPITAL_COMMUNITY): Payer: Self-pay

## 2018-10-03 DIAGNOSIS — J020 Streptococcal pharyngitis: Secondary | ICD-10-CM | POA: Diagnosis not present

## 2018-10-03 DIAGNOSIS — Z20828 Contact with and (suspected) exposure to other viral communicable diseases: Secondary | ICD-10-CM | POA: Insufficient documentation

## 2018-10-03 DIAGNOSIS — R509 Fever, unspecified: Secondary | ICD-10-CM | POA: Diagnosis present

## 2018-10-03 LAB — GROUP A STREP BY PCR: Group A Strep by PCR: DETECTED — AB

## 2018-10-03 MED ORDER — PREDNISONE 20 MG PO TABS: 40.0000 mg | ORAL_TABLET | Freq: Every day | ORAL | 0 refills | Status: AC

## 2018-10-03 MED ORDER — CEPHALEXIN 250 MG PO CAPS
500.0000 mg | ORAL_CAPSULE | Freq: Once | ORAL | Status: AC
  Administered 2018-10-03: 500 mg via ORAL
  Filled 2018-10-03: qty 2

## 2018-10-03 MED ORDER — PREDNISONE 20 MG PO TABS
40.0000 mg | ORAL_TABLET | Freq: Once | ORAL | Status: AC
  Administered 2018-10-03: 40 mg via ORAL
  Filled 2018-10-03: qty 2

## 2018-10-03 MED ORDER — ACETAMINOPHEN 500 MG PO TABS
1000.0000 mg | ORAL_TABLET | Freq: Once | ORAL | Status: AC
Start: 2018-10-03 — End: 2018-10-03
  Administered 2018-10-03: 1000 mg via ORAL
  Filled 2018-10-03: qty 2

## 2018-10-03 MED ORDER — CEPHALEXIN 500 MG PO CAPS: 500.0000 mg | ORAL_CAPSULE | Freq: Two times a day (BID) | ORAL | 0 refills | Status: AC

## 2018-10-03 NOTE — ED Triage Notes (Signed)
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

## 2018-10-03 NOTE — Discharge Instructions (Signed)

## 2018-10-03 NOTE — ED Provider Notes (Signed)
[facility] EMERGENCY DEPARTMENT Provider Note   CSN: [id number] Arrival date & time: 10/03/18  2000    History   Chief Complaint Chief Complaint  Patient presents with   Fever    HPI Whitney Wheeler is a 28 y.o. female with no significant past medical history who presents today for evaluation of fever, chills, body aches, loss of appetite, sore throat and right ear pain that started today.  She reports that when she woke up she was feeling slightly unwell and took a cold and flu medicine after which she felt better for approximately 4 hours and then states that once it wore off she felt much worse.  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    She denies cough, chest pain, or shortness of breath.  No abdominal pain, dysuria increased frequency or urgency.  She denies pelvic pain, abnormal vaginal discharge, tick bites, rashes or wounds.     HPI  Past Medical History:  Diagnosis Date   Anemia    Hx of varicella     Patient Active Problem List   Diagnosis Date Noted   Pregnancy 06/26/2015    Past Surgical History:  Procedure Laterality Date   CESAREAN SECTION N/A 06/26/2015   Procedure: CESAREAN SECTION;  Surgeon: Molli Posey, MD;  Location: [facility];  Service: Obstetrics;  Laterality: N/A;   NO PAST SURGERIES       OB History    Gravida  1   Para  1   Term  1   Preterm      AB      Living  1     SAB      TAB      Ectopic      Multiple  0   Live Births  1            Home Medications    Prior to Admission medications   Medication Sig Start Date End Date Taking? Authorizing Provider  cephALEXin (KEFLEX) 500 MG capsule Take 1 capsule (500 mg total) by mouth 2 (two) times daily for 10 days. 10/03/18 10/13/18  Lorin Glass, PA-C  ibuprofen (ADVIL,MOTRIN) 800 MG tablet Take 1 tablet (800 mg total) by  mouth every 8 (eight) hours as needed for moderate pain. 06/28/15   Dian Queen, MD  oxyCODONE (OXY IR/ROXICODONE) 5 MG immediate release tablet Take 1 tablet (5 mg total) by mouth every 4 (four) hours as needed (pain scale 4-7). 06/28/15   Dian Queen, MD  predniSONE (DELTASONE) 20 MG tablet Take 2 tablets (40 mg total) by mouth daily for 4 days. 10/03/18 10/07/18  Lorin Glass, PA-C  Prenatal Vit-Fe Fumarate-FA (PRENATAL MULTIVITAMIN) TABS tablet Take 1 tablet by mouth daily at 12 noon.    [provider]    Family History Family History  Problem Relation Age of Onset   Delorse Limber White syndrome Mother    Hypertension Mother    Kidney Stones Mother    Hypertension Father    Alcohol abuse Maternal Grandfather    Diabetes Maternal Grandfather    Kidney Stones Paternal Grandmother     Social History Social History   Tobacco Use   Smoking status: Never Smoker   Smokeless tobacco: Never Used  Substance Use Topics   Alcohol use: No   Drug use: No  Allergies   Penicillins   Review of Systems Review of Systems  Constitutional: Positive for chills, fatigue and fever.  HENT: Positive for ear pain and sore throat. Negative for congestion.   Eyes: Negative for visual disturbance.  Respiratory: Negative for cough, chest tightness and shortness of breath.   Cardiovascular: Negative for chest pain, palpitations and leg swelling.  Gastrointestinal: Negative for diarrhea, nausea and vomiting.  Genitourinary: Negative for dysuria, frequency, hematuria, pelvic pain, urgency and vaginal discharge.  Musculoskeletal: Positive for myalgias.  Neurological: Negative for headaches.  Psychiatric/Behavioral: Negative for confusion.  All other systems reviewed and are negative.    Physical Exam Updated Vital Signs BP 113/78    Pulse 90    Temp (!) 100.7 F (38.2 C) (Oral)    Resp 16    Ht 4\' 9"  (1.448 m)    Wt 73.9 kg    LMP 09/22/2018    SpO2 100%     BMI 35.27 kg/m   Physical Exam Vitals signs and nursing note reviewed.  Constitutional:      General: She is not in acute distress.    Appearance: She is not ill-appearing.  HENT:     Head: Normocephalic.     Jaw: There is normal jaw occlusion. No trismus or swelling.     Right Ear: Tympanic membrane, ear canal and external ear normal.     Left Ear: Tympanic membrane, ear canal and external ear normal.     Nose: Nose normal. No congestion.     Mouth/Throat:     Lips: Pink.     Mouth: Mucous membranes are moist. No injury.     Dentition: No gingival swelling or dental abscesses.     Tongue: No lesions. Tongue does not deviate from midline.     Palate: Lesions (Petechiae) present.     Pharynx: Uvula midline. No pharyngeal swelling or uvula swelling.     Tonsils: Tonsillar exudate present. No tonsillar abscesses. 2+ on the right. 2+ on the left.  Eyes:     Conjunctiva/sclera: Conjunctivae normal.  Neck:     Musculoskeletal: Normal range of motion and neck supple. No neck rigidity or muscular tenderness.  Cardiovascular:     Rate and Rhythm: Normal rate.  Pulmonary:     Effort: Pulmonary effort is normal. No respiratory distress.  Abdominal:     General: There is no distension.     Tenderness: There is no abdominal tenderness.  Musculoskeletal:     Right lower leg: No edema.     Left lower leg: No edema.  Lymphadenopathy:     Cervical: Cervical adenopathy (Mild, bilateral) present.  Skin:    General: Skin is warm and dry.  Neurological:     General: No focal deficit present.     Mental Status: She is alert and oriented to person, place, and time.  Psychiatric:        Mood and Affect: Mood normal.        Behavior: Behavior normal.      ED Treatments / Results  Labs (all labs ordered are listed, but only abnormal results are displayed) Labs Reviewed  GROUP A STREP BY PCR - Abnormal; Notable for the following components:      Result Value   Group A Strep by PCR  DETECTED (*)    All other components within normal limits  NOVEL CORONAVIRUS, NAA (HOSPITAL ORDER, SEND-OUT TO REF LAB)    EKG None  Radiology No results found.  Procedures Procedures (including critical  care time)  Medications Ordered in ED Medications  acetaminophen (TYLENOL) tablet 1,000 mg (1,000 mg Oral Given 10/03/18 2120)  cephALEXin (KEFLEX) capsule 500 mg (500 mg Oral Given 10/03/18 2248)  predniSONE (DELTASONE) tablet 40 mg (40 mg Oral Given 10/03/18 2248)     Initial Impression / Assessment and Plan / ED Course  I have reviewed the triage vital signs and the nursing notes.  Pertinent labs & imaging results that were available during my care of the patient were reviewed by me and considered in my medical decision making (see chart for details).        Pt febrile with tonsillar exudate, cervical lymphadenopathy, & dysphagia; diagnosis of strep. Treated in the Ed with steroids.  She has a listed allergy to penicillins, she does not know what her reaction is however thinks that she has had amoxicillin before without difficulty.  She and I discussed rare possibility of cephalosporin reaction versus the risks of clindamycin and she wishes for treatment with Keflex at this time.  Pt appears mildly dehydrated, discussed importance of water rehydration. Presentation non concerning for PTA or infxn spread to soft tissue. No trismus or uvula deviation. Specific return precautions discussed. Pt able to drink water in ED without difficulty with intact air way. Recommended PCP follow up.    Final Clinical Impressions(s) / ED Diagnoses   Final diagnoses:  Strep throat    ED Discharge Orders         Ordered    cephALEXin (KEFLEX) 500 MG capsule  2 times daily     10/03/18 2229    predniSONE (DELTASONE) 20 MG tablet  Daily     10/03/18 2229           Cristina GongHammond, Juliene Kirsh W, [place] JerseyPA-C 10/04/18 0003    Gwyneth SproutPlunkett, Whitney, MD 10/04/18 1635

## 2018-10-05 LAB — NOVEL CORONAVIRUS, NAA (HOSP ORDER, SEND-OUT TO REF LAB; TAT 18-24 HRS): SARS-CoV-2, NAA: NOT DETECTED

## 2019-03-26 ENCOUNTER — Ambulatory Visit (HOSPITAL_COMMUNITY)
Admission: EM | Admit: 2019-03-26 | Discharge: 2019-03-26 | Disposition: A | Discharge status: Home / Self Care | Payer: BC Managed Care – PPO | Attending: Internal Medicine | Admitting: Internal Medicine

## 2019-03-26 ENCOUNTER — Encounter (HOSPITAL_COMMUNITY): Payer: Self-pay | Admitting: Emergency Medicine

## 2019-03-26 ENCOUNTER — Other Ambulatory Visit: Payer: Self-pay

## 2019-03-26 DIAGNOSIS — Z20822 Contact with and (suspected) exposure to covid-19: Secondary | ICD-10-CM | POA: Diagnosis present

## 2019-03-26 NOTE — ED Triage Notes (Signed)
Pt 's daughter was exposed to Covid at daycare one week ago.  Neither her daughter nor her have had any symptoms.

## 2019-03-26 NOTE — ED Provider Notes (Signed)
[facility]    CSN: [id number] Arrival date & time: 03/26/19  1647      History   Chief Complaint Chief Complaint  Patient presents with  . Covid Exposure    HPI Whitney Wheeler is a 29 y.o. female is here for Covid testing.  Patient's daughter was exposed to a Covid positive individual.  She currently has no symptoms.   HPI  Past Medical History:  Diagnosis Date  . Anemia   . Hx of varicella     Patient Active Problem List   Diagnosis Date Noted  . Pregnancy 06/26/2015    Past Surgical History:  Procedure Laterality Date  . CESAREAN SECTION N/A 06/26/2015   Procedure: CESAREAN SECTION;  Surgeon: Molli Posey, MD;  Location: [facility];  Service: Obstetrics;  Laterality: N/A;  . NO PAST SURGERIES      OB History    Gravida  1   Para  1   Term  1   Preterm      AB      Living  1     SAB      TAB      Ectopic      Multiple  0   Live Births  1            Home Medications    Prior to Admission medications   Medication Sig Start Date End Date Taking? Authorizing Provider  ibuprofen (ADVIL,MOTRIN) 800 MG tablet Take 1 tablet (800 mg total) by mouth every 8 (eight) hours as needed for moderate pain. 06/28/15   Dian Queen, MD  oxyCODONE (OXY IR/ROXICODONE) 5 MG immediate release tablet Take 1 tablet (5 mg total) by mouth every 4 (four) hours as needed (pain scale 4-7). 06/28/15   Dian Queen, MD  Prenatal Vit-Fe Fumarate-FA (PRENATAL MULTIVITAMIN) TABS tablet Take 1 tablet by mouth daily at 12 noon.    [provider]    Family History Family History  Problem Relation Age of Onset  . [place] syndrome Mother   . Hypertension Mother   . Kidney Stones Mother   . Hypertension Father   . Alcohol abuse Maternal Grandfather   . Diabetes Maternal Grandfather   . Kidney Stones Paternal Grandmother     Social History Social History   Tobacco Use  . Smoking status: Never Smoker  . Smokeless tobacco:  Never Used  Substance Use Topics  . Alcohol use: No  . Drug use: No     Allergies   Penicillins   Review of Systems Review of Systems  Constitutional: Negative.   HENT: Negative.   Genitourinary: Negative.   Musculoskeletal: Negative.      Physical Exam Triage Vital Signs ED Triage Vitals  Enc Vitals Group     BP --      Pulse Rate 03/26/19 1739 73     Resp 03/26/19 1739 16     Temp 03/26/19 1739 98.2 F (36.8 C)     Temp src --      SpO2 03/26/19 1739 100 %     Weight --      Height --      Head Circumference --      Peak Flow --      Pain Score 03/26/19 1735 0     Pain Loc --      Pain Edu? --      Excl. in [place]? --    No data found.  Updated Vital Signs Pulse 73  Temp 98.2 F (36.8 C)   Resp 16   LMP 03/11/2019 (Exact Date)   SpO2 100%   Breastfeeding No   Visual Acuity Right Eye Distance:   Left Eye Distance:   Bilateral Distance:    Right Eye Near:   Left Eye Near:    Bilateral Near:     Physical Exam Constitutional:      General: She is not in acute distress.    Appearance: Normal appearance. She is obese. She is not ill-appearing, toxic-appearing or diaphoretic.  Neurological:     Mental Status: She is alert.  Psychiatric:        Mood and Affect: Mood normal.        Behavior: Behavior normal.      UC Treatments / Results  Labs (all labs ordered are listed, but only abnormal results are displayed) Labs Reviewed  NOVEL CORONAVIRUS, NAA (HOSP ORDER, SEND-OUT TO REF LAB; TAT 18-24 HRS)    EKG   Radiology No results found.  Procedures Procedures (including critical care time)  Medications Ordered in UC Medications - No data to display  Initial Impression / Assessment and Plan / UC Course  I have reviewed the triage vital signs and the nursing notes.  Pertinent labs & imaging results that were available during my care of the patient were reviewed by me and considered in my medical decision making (see chart for  details).     1.  COVID-19 exposure: COVID-19 PCR test sent Patient is advised to self isolate until COVID-19 test results are available. If patient develops symptoms she is advised to reach out to the urgent care division via video visit or in person for symptom management. Final Clinical Impressions(s) / UC Diagnoses   Final diagnoses:  Close exposure to COVID-19 virus   Discharge Instructions   None    ED Prescriptions    None     PDMP not reviewed this encounter.   Merrilee Jansky, MD 03/26/19 [phone]

## 2019-03-28 LAB — NOVEL CORONAVIRUS, NAA (HOSP ORDER, SEND-OUT TO REF LAB; TAT 18-24 HRS): SARS-CoV-2, NAA: NOT DETECTED

## 2019-08-07 ENCOUNTER — Ambulatory Visit: Payer: BC Managed Care – PPO | Admitting: Internal Medicine
